# Patient Record
Sex: Male | Born: 1980 | Race: Black or African American | Hispanic: No | Marital: Single | Smoking: Current every day smoker
Health system: Southern US, Community
[De-identification: ages and names within clinical notes are randomized; demographics above are authoritative.]

---

## 2005-04-15 ENCOUNTER — Emergency Department (HOSPITAL_COMMUNITY): Admission: EM | Admit: 2005-04-15 | Discharge: 2005-04-15 | Payer: Self-pay | Admitting: Emergency Medicine

## 2018-10-11 ENCOUNTER — Encounter (HOSPITAL_COMMUNITY): Payer: Self-pay

## 2018-10-11 ENCOUNTER — Emergency Department (HOSPITAL_COMMUNITY)
Admission: EM | Admit: 2018-10-11 | Discharge: 2018-10-11 | Disposition: A | Discharge status: Home / Self Care | Payer: Self-pay | Attending: Emergency Medicine | Admitting: Emergency Medicine

## 2018-10-11 ENCOUNTER — Other Ambulatory Visit: Payer: Self-pay

## 2018-10-11 DIAGNOSIS — M545 Low back pain, unspecified: Secondary | ICD-10-CM

## 2018-10-11 DIAGNOSIS — F17210 Nicotine dependence, cigarettes, uncomplicated: Secondary | ICD-10-CM | POA: Insufficient documentation

## 2018-10-11 MED ORDER — NAPROXEN SODIUM 550 MG PO TABS: 550.0000 mg | ORAL_TABLET | Freq: Two times a day (BID) | ORAL | 0 refills | Status: DC

## 2018-10-11 MED ORDER — KETOROLAC TROMETHAMINE 60 MG/2ML IM SOLN
30.0000 mg | Freq: Once | INTRAMUSCULAR | Status: AC
  Administered 2018-10-11: 30 mg via INTRAMUSCULAR
  Filled 2018-10-11: qty 2

## 2018-10-11 MED ORDER — CYCLOBENZAPRINE HCL 10 MG PO TABS
10.0000 mg | ORAL_TABLET | Freq: Once | ORAL | Status: AC
  Administered 2018-10-11: 10 mg via ORAL
  Filled 2018-10-11: qty 1

## 2018-10-11 MED ORDER — CYCLOBENZAPRINE HCL 10 MG PO TABS: 10.0000 mg | ORAL_TABLET | Freq: Two times a day (BID) | ORAL | 0 refills | Status: DC | PRN

## 2018-10-11 NOTE — Discharge Instructions (Signed)
Do not take the the muscle relaxer if you are driving or at work because it will make you sleepy.

## 2018-10-11 NOTE — ED Triage Notes (Signed)
Patient c/o mid lower back pain and states pain radiates down the right leg since this AM. Patient states he does heavy lifting at his place of employment.

## 2018-10-11 NOTE — ED Provider Notes (Signed)
[facility]-EMERGENCY DEPT Provider Note   CSN: [id number] Arrival date & time: 10/11/18  1452     History   Chief Complaint Chief Complaint  Patient presents with  . Back Pain    HPI Kevin Morse is a 38 y.o. male who presents to the ED with back pain. Patient reports having back problems a year ago but not again until today. Patient reports that last week he was doing some heavy lifting at work and maybe that caused the problem. Patient denies loss of control of bladder or bowels.   The history is provided by the patient. No language interpreter was used.  Back Pain  Location:  Lumbar spine Quality:  Aching Pain severity:  Moderate Onset quality:  Gradual Duration:  1 day Timing:  Constant Progression:  Unchanged Chronicity:  New Context: lifting heavy objects   Relieved by:  Nothing Worsened by:  Ambulation and twisting Associated symptoms: no bladder incontinence and no bowel incontinence     History reviewed. No pertinent past medical history.  There are no active problems to display for this patient.   History reviewed. No pertinent surgical history.      Home Medications    Prior to Admission medications   Medication Sig Start Date End Date Taking? Authorizing Provider  cyclobenzaprine (FLEXERIL) 10 MG tablet Take 1 tablet (10 mg total) by mouth 2 (two) times daily as needed for muscle spasms. 10/11/18   Janne NapoleonNeese, Hope M, NP  naproxen sodium (ANAPROX DS) 550 MG tablet Take 1 tablet (550 mg total) by mouth 2 (two) times daily with a meal. 10/11/18   Janne NapoleonNeese, Hope M, NP    Family History Family History  Problem Relation Age of Onset  . Thyroid disease Mother     Social History Social History   Tobacco Use  . Smoking status: Current Every Day Smoker    Packs/day: 0.50    Types: Cigarettes  . Smokeless tobacco: Never Used  Substance Use Topics  . Alcohol use: Never    Frequency: Never  . Drug use: Not Currently    Types: Marijuana      Comment: none past 40 days     Allergies   Patient has no known allergies.   Review of Systems Review of Systems  Gastrointestinal: Negative for bowel incontinence.  Genitourinary: Negative for bladder incontinence.  Musculoskeletal: Positive for back pain.     Physical Exam Updated Vital Signs BP (!) 140/92 (BP Location: Right Arm)   Pulse 96   Temp 97.8 F (36.6 C) (Oral)   Resp 18   Ht 6' (1.829 m)   Wt 115.2 kg   SpO2 98%   BMI 34.45 kg/m   Physical Exam Vitals signs and nursing note reviewed.  Constitutional:      General: He is not in acute distress.    Appearance: He is well-developed.  HENT:     Head: Normocephalic and atraumatic.     Nose: Nose normal.  Eyes:     Conjunctiva/sclera: Conjunctivae normal.  Neck:     Musculoskeletal: Neck supple.  Cardiovascular:     Rate and Rhythm: Normal rate.  Pulmonary:     Effort: Pulmonary effort is normal.  Abdominal:     Palpations: Abdomen is soft.     Tenderness: There is no abdominal tenderness.  Musculoskeletal:     Lumbar back: He exhibits tenderness, pain and spasm. He exhibits no deformity and normal pulse. Decreased range of motion: due to pain.  Skin:  General: Skin is warm and dry.  Neurological:     Mental Status: He is alert and oriented to person, place, and time.     Cranial Nerves: No cranial nerve deficit.  Psychiatric:        Mood and Affect: Mood normal.      ED Treatments / Results  Labs (all labs ordered are listed, but only abnormal results are displayed) Labs Reviewed - No data to display  Radiology No results found.  Procedures Procedures (including critical care time)  Medications Ordered in ED Medications  ketorolac (TORADOL) injection 30 mg (has no administration in time range)  cyclobenzaprine (FLEXERIL) tablet 10 mg (10 mg Oral Given 10/11/18 1625)     Initial Impression / Assessment and Plan / ED Course  I have reviewed the triage vital signs and the  nursing notes.  Patient with back pain.  No neurological deficits and normal neuro exam.  Patient can walk but states is painful.  No loss of bowel or bladder control.  No concern for cauda equina.  No fever, night sweats, weight loss, h/o cancer, IVDU.  RICE protocol and pain medicine indicated and discussed with patient.  Final Clinical Impressions(s) / ED Diagnoses   Final diagnoses:  Acute midline low back pain without sciatica    ED Discharge Orders         Ordered    cyclobenzaprine (FLEXERIL) 10 MG tablet  2 times daily PRN     10/11/18 1618    naproxen sodium (ANAPROX DS) 550 MG tablet  2 times daily with meals     10/11/18 [address] [place], NP 10/11/18 1625    Linwood Dibbles, MD 10/13/18 1319

## 2018-12-12 ENCOUNTER — Other Ambulatory Visit: Payer: Self-pay

## 2018-12-12 ENCOUNTER — Encounter (HOSPITAL_COMMUNITY): Payer: Self-pay | Admitting: Emergency Medicine

## 2018-12-12 ENCOUNTER — Emergency Department (HOSPITAL_COMMUNITY)
Admission: EM | Admit: 2018-12-12 | Discharge: 2018-12-12 | Disposition: A | Discharge status: Home / Self Care | Payer: Self-pay | Attending: Emergency Medicine | Admitting: Emergency Medicine

## 2018-12-12 DIAGNOSIS — Z560 Unemployment, unspecified: Secondary | ICD-10-CM

## 2018-12-12 DIAGNOSIS — Z0279 Encounter for issue of other medical certificate: Secondary | ICD-10-CM | POA: Insufficient documentation

## 2018-12-12 NOTE — ED Triage Notes (Signed)
Patient reports missing work on Friday because he "wasn't feeling well." Requesting work note. Denies complaints now.

## 2018-12-12 NOTE — Discharge Instructions (Signed)
You were seen in the ED today for a work note for Friday March 20,2020. I am unable to provide a note for a previous day. Because you have no complaints, I have provided a note to go work to work December 13, 2018. I have also provided the number to the [facility] health clinic to establish primary care.

## 2018-12-12 NOTE — ED Provider Notes (Signed)
[facility]-EMERGENCY DEPT Provider Note   CSN: [id number] Arrival date & time: 12/12/18  1910    History   Chief Complaint Chief Complaint  Patient presents with  . needs work note    HPI Kevin Morse is a 38 y.o. male.     38 y.o male with no PMH presents to the ED requesting a work note for this past Friday, March 20.  Patient reports that day he had a low temp fever, abdominal pain, felt "I just did not feel well ".  He reports the symptoms resolved.  However, he was asked to bring a note to return back to work on Monday.  Patient is requesting for me to fill out form for his return back to work.  He denies any chest pain, shortness of breath, fever, other complaints.     History reviewed. No pertinent past medical history.  There are no active problems to display for this patient.   History reviewed. No pertinent surgical history.      Home Medications    Prior to Admission medications   Medication Sig Start Date End Date Taking? Authorizing Provider  cyclobenzaprine (FLEXERIL) 10 MG tablet Take 1 tablet (10 mg total) by mouth 2 (two) times daily as needed for muscle spasms. 10/11/18   Janne Napoleon, NP  naproxen sodium (ANAPROX DS) 550 MG tablet Take 1 tablet (550 mg total) by mouth 2 (two) times daily with a meal. 10/11/18   Janne Napoleon, NP    Family History Family History  Problem Relation Age of Onset  . Thyroid disease Mother     Social History Social History   Tobacco Use  . Smoking status: Current Every Day Smoker    Packs/day: 0.50    Types: Cigarettes  . Smokeless tobacco: Never Used  Substance Use Topics  . Alcohol use: Never    Frequency: Never  . Drug use: Not Currently    Types: Marijuana    Comment: none past 40 days     Allergies   Patient has no known allergies.   Review of Systems Review of Systems  Constitutional: Negative for fever.  Respiratory: Negative for cough.   Cardiovascular: Negative for chest  pain.     Physical Exam Updated Vital Signs BP (!) 136/93 (BP Location: Right Arm)   Pulse 83   Temp 99.4 F (37.4 C) (Oral)   Resp 18   SpO2 100%   Physical Exam Vitals signs and nursing note reviewed.  Constitutional:      Appearance: He is well-developed.     Comments: Well-appearing, on his cell phone.  HENT:     Head: Normocephalic and atraumatic.  Eyes:     General: No scleral icterus.    Pupils: Pupils are equal, round, and reactive to light.  Neck:     Musculoskeletal: Normal range of motion.  Cardiovascular:     Heart sounds: Normal heart sounds.  Pulmonary:     Effort: Pulmonary effort is normal.     Breath sounds: No wheezing.  Chest:     Chest wall: No tenderness.  Abdominal:     General: Bowel sounds are normal. There is no distension.     Palpations: Abdomen is soft.     Tenderness: There is no abdominal tenderness.  Musculoskeletal:        General: No tenderness or deformity.  Skin:    General: Skin is warm and dry.  Neurological:     Mental Status: He  is alert and oriented to person, place, and time.      ED Treatments / Results  Labs (all labs ordered are listed, but only abnormal results are displayed) Labs Reviewed - No data to display  EKG None  Radiology No results found.  Procedures Procedures (including critical care time)  Medications Ordered in ED Medications - No data to display   Initial Impression / Assessment and Plan / ED Course  I have reviewed the triage vital signs and the nursing notes.  Pertinent labs & imaging results that were available during my care of the patient were reviewed by me and considered in my medical decision making (see chart for details).      Patient with no chief complaints aside from requesting work note presents to the ED.  During evaluation patient stable, playing on his cell phone.  Reports he did not feel well on Friday and would like a note to be excused for not working on Friday.   During evaluation he reports no fever, cough, chest pain or shortness of breath.  He reports a form needs to be filled out in order for him to return back to work. I have informed patient I cannot provide a note for work for Friday due to not being seen in the ED at that time.  Patient understands at this time.  Will provide him with note to return back to work tomorrow without restrictions.  He once again denies any complaint at this time.  Patient discharged from the ED with stable vital signs.   Final Clinical Impressions(s) / ED Diagnoses   Final diagnoses:  Out of work    ED Discharge Orders    None       Claude Manges, Cordelia Poche 12/12/18 2219    Azalia Bilis, MD 12/12/18 2223

## 2019-01-12 ENCOUNTER — Ambulatory Visit (HOSPITAL_COMMUNITY)
Admission: EM | Admit: 2019-01-12 | Discharge: 2019-01-12 | Disposition: A | Discharge status: Home / Self Care | Payer: Self-pay | Attending: Family Medicine | Admitting: Family Medicine

## 2019-01-12 NOTE — ED Triage Notes (Signed)
Pt to follow up with either [facility]CP of [facility] for work return paperwork

## 2019-07-17 ENCOUNTER — Emergency Department (HOSPITAL_COMMUNITY): Payer: No Typology Code available for payment source

## 2019-07-17 ENCOUNTER — Encounter (HOSPITAL_COMMUNITY): Payer: Self-pay

## 2019-07-17 ENCOUNTER — Other Ambulatory Visit: Payer: Self-pay

## 2019-07-17 ENCOUNTER — Emergency Department (HOSPITAL_COMMUNITY)
Admission: EM | Admit: 2019-07-17 | Discharge: 2019-07-17 | Disposition: A | Discharge status: Home / Self Care | Payer: No Typology Code available for payment source | Attending: Emergency Medicine | Admitting: Emergency Medicine

## 2019-07-17 DIAGNOSIS — M546 Pain in thoracic spine: Secondary | ICD-10-CM | POA: Insufficient documentation

## 2019-07-17 DIAGNOSIS — Y999 Unspecified external cause status: Secondary | ICD-10-CM | POA: Diagnosis not present

## 2019-07-17 DIAGNOSIS — F17210 Nicotine dependence, cigarettes, uncomplicated: Secondary | ICD-10-CM | POA: Insufficient documentation

## 2019-07-17 DIAGNOSIS — Y92410 Unspecified street and highway as the place of occurrence of the external cause: Secondary | ICD-10-CM | POA: Insufficient documentation

## 2019-07-17 DIAGNOSIS — Z791 Long term (current) use of non-steroidal anti-inflammatories (NSAID): Secondary | ICD-10-CM | POA: Diagnosis not present

## 2019-07-17 DIAGNOSIS — M542 Cervicalgia: Secondary | ICD-10-CM | POA: Diagnosis not present

## 2019-07-17 DIAGNOSIS — M25562 Pain in left knee: Secondary | ICD-10-CM | POA: Insufficient documentation

## 2019-07-17 DIAGNOSIS — Y93I9 Activity, other involving external motion: Secondary | ICD-10-CM | POA: Diagnosis not present

## 2019-07-17 MED ORDER — METHOCARBAMOL 500 MG PO TABS: 500.0000 mg | ORAL_TABLET | Freq: Two times a day (BID) | ORAL | 0 refills | Status: DC

## 2019-07-17 MED ORDER — ACETAMINOPHEN 500 MG PO TABS: 500.0000 mg | ORAL_TABLET | Freq: Four times a day (QID) | ORAL | 0 refills | Status: AC | PRN

## 2019-07-17 MED ORDER — IBUPROFEN 600 MG PO TABS: 600.0000 mg | ORAL_TABLET | Freq: Four times a day (QID) | ORAL | 0 refills | Status: DC | PRN

## 2019-07-17 MED ORDER — KETOROLAC TROMETHAMINE 30 MG/ML IJ SOLN
30.0000 mg | Freq: Once | INTRAMUSCULAR | Status: AC
  Administered 2019-07-17: 30 mg via INTRAMUSCULAR
  Filled 2019-07-17: qty 1

## 2019-07-17 NOTE — ED Notes (Signed)
Patient transported to CT 

## 2019-07-17 NOTE — Discharge Instructions (Signed)
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
## 2019-07-17 NOTE — ED Notes (Signed)
Patient Alert and oriented to baseline. Stable and ambulatory to baseline. Patient verbalized understanding of the discharge instructions.  Patient belongings were taken by the patient.   
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
## 2021-06-01 ENCOUNTER — Emergency Department (HOSPITAL_COMMUNITY)
Admission: EM | Admit: 2021-06-01 | Discharge: 2021-06-01 | Disposition: A | Discharge status: Home / Self Care | Payer: No Typology Code available for payment source | Attending: Emergency Medicine | Admitting: Emergency Medicine

## 2021-06-01 ENCOUNTER — Encounter (HOSPITAL_COMMUNITY): Payer: Self-pay | Admitting: Emergency Medicine

## 2021-06-01 ENCOUNTER — Emergency Department (HOSPITAL_COMMUNITY): Payer: No Typology Code available for payment source

## 2021-06-01 ENCOUNTER — Other Ambulatory Visit: Payer: Self-pay

## 2021-06-01 DIAGNOSIS — S39012A Strain of muscle, fascia and tendon of lower back, initial encounter: Secondary | ICD-10-CM | POA: Diagnosis not present

## 2021-06-01 DIAGNOSIS — S161XXA Strain of muscle, fascia and tendon at neck level, initial encounter: Secondary | ICD-10-CM | POA: Diagnosis not present

## 2021-06-01 DIAGNOSIS — R0789 Other chest pain: Secondary | ICD-10-CM | POA: Insufficient documentation

## 2021-06-01 DIAGNOSIS — Y92410 Unspecified street and highway as the place of occurrence of the external cause: Secondary | ICD-10-CM | POA: Insufficient documentation

## 2021-06-01 DIAGNOSIS — F17210 Nicotine dependence, cigarettes, uncomplicated: Secondary | ICD-10-CM | POA: Diagnosis not present

## 2021-06-01 DIAGNOSIS — S199XXA Unspecified injury of neck, initial encounter: Secondary | ICD-10-CM | POA: Diagnosis present

## 2021-06-01 MED ORDER — CYCLOBENZAPRINE HCL 10 MG PO TABS
10.0000 mg | ORAL_TABLET | Freq: Once | ORAL | Status: AC
  Administered 2021-06-01: 10 mg via ORAL
  Filled 2021-06-01: qty 1

## 2021-06-01 MED ORDER — CYCLOBENZAPRINE HCL 10 MG PO TABS: 10.0000 mg | ORAL_TABLET | Freq: Two times a day (BID) | ORAL | 0 refills | Status: DC | PRN

## 2021-06-01 MED ORDER — ACETAMINOPHEN 500 MG PO TABS
1000.0000 mg | ORAL_TABLET | Freq: Once | ORAL | Status: AC
  Administered 2021-06-01: 1000 mg via ORAL
  Filled 2021-06-01: qty 2

## 2021-06-01 MED ORDER — KETOROLAC TROMETHAMINE 60 MG/2ML IM SOLN
30.0000 mg | Freq: Once | INTRAMUSCULAR | Status: AC
  Administered 2021-06-01: 30 mg via INTRAMUSCULAR
  Filled 2021-06-01: qty 2

## 2021-06-01 NOTE — Discharge Instructions (Addendum)
Recommend Tylenol and ibuprofen for pain control.  Will prescribe Flexeril for muscle spasms.  There is no evidence of acute fracture on your lumbar spine CT.
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
## 2021-09-02 ENCOUNTER — Emergency Department (HOSPITAL_COMMUNITY)
Admission: EM | Admit: 2021-09-02 | Discharge: 2021-09-02 | Disposition: A | Discharge status: Home / Self Care | Payer: Self-pay | Attending: Emergency Medicine | Admitting: Emergency Medicine

## 2021-09-02 DIAGNOSIS — M545 Low back pain, unspecified: Secondary | ICD-10-CM | POA: Insufficient documentation

## 2021-09-02 DIAGNOSIS — Z5321 Procedure and treatment not carried out due to patient leaving prior to being seen by health care provider: Secondary | ICD-10-CM | POA: Insufficient documentation
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
## 2021-09-02 NOTE — ED Notes (Signed)
PT called 3x for room w/o response. Pt not here at this time

## 2021-09-02 NOTE — ED Notes (Signed)
No response x1  

## 2022-11-16 ENCOUNTER — Emergency Department (HOSPITAL_COMMUNITY)
Admission: EM | Admit: 2022-11-16 | Discharge: 2022-11-16 | Disposition: A | Discharge status: Home / Self Care | Payer: Self-pay | Attending: Emergency Medicine | Admitting: Emergency Medicine

## 2022-11-16 ENCOUNTER — Encounter (HOSPITAL_COMMUNITY): Payer: Self-pay

## 2022-11-16 ENCOUNTER — Other Ambulatory Visit: Payer: Self-pay

## 2022-11-16 DIAGNOSIS — M5441 Lumbago with sciatica, right side: Secondary | ICD-10-CM | POA: Insufficient documentation

## 2022-11-16 DIAGNOSIS — M5126 Other intervertebral disc displacement, lumbar region: Secondary | ICD-10-CM | POA: Insufficient documentation

## 2022-11-16 DIAGNOSIS — G8929 Other chronic pain: Secondary | ICD-10-CM | POA: Insufficient documentation

## 2022-11-16 DIAGNOSIS — M48061 Spinal stenosis, lumbar region without neurogenic claudication: Secondary | ICD-10-CM | POA: Insufficient documentation

## 2022-11-16 DIAGNOSIS — M5442 Lumbago with sciatica, left side: Secondary | ICD-10-CM | POA: Insufficient documentation

## 2022-11-16 MED ORDER — IBUPROFEN 600 MG PO TABS: 600.0000 mg | ORAL_TABLET | Freq: Three times a day (TID) | ORAL | 0 refills | Status: AC | PRN

## 2022-11-16 MED ORDER — CYCLOBENZAPRINE HCL 10 MG PO TABS: 10.0000 mg | ORAL_TABLET | Freq: Three times a day (TID) | ORAL | 0 refills | Status: AC | PRN

## 2022-11-16 MED ORDER — KETOROLAC TROMETHAMINE 15 MG/ML IJ SOLN
15.0000 mg | Freq: Once | INTRAMUSCULAR | Status: AC
  Administered 2022-11-16: 15 mg via INTRAMUSCULAR
  Filled 2022-11-16: qty 1

## 2022-11-16 NOTE — Discharge Instructions (Signed)
Thank you for letting us take care of you today.  With your longstanding history of back pain and previous CT scan showing a herniated disc and spinal stenosis, I believe that it would be best for you to follow-up in the outpatient setting with primary care and neurosurgery. They may want to try medications, surgery, or physical therapy to help with your symptoms. To treat your current flareup of pain, I am prescribing a short course of muscle relaxants and NSAIDs. I have also provided some typical exercises for back pain that may help you manage your pain long-term. Please take the medications as prescribed. You may take Tylenol in addition to these medications. It is important to remain active to prevent further spasming of your back muscles and worsening of your pain.  If you develop fever, abdominal pain, weakness in your legs, inability to walk, urinate or defecate on yourself, or other new, concerning symptoms, please return to nearest emergency department for re-evaluation.
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
## 2022-11-16 NOTE — ED Notes (Signed)
AVS reviewed with pt prior to discharge. Pt verbalizes understanding. Belongings with pt upon depart. Ambulatory to POV by self.
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
## 2023-07-05 ENCOUNTER — Other Ambulatory Visit: Payer: Self-pay

## 2023-07-05 ENCOUNTER — Encounter (HOSPITAL_COMMUNITY): Payer: Self-pay | Admitting: Pharmacy Technician

## 2023-07-05 ENCOUNTER — Emergency Department (HOSPITAL_COMMUNITY)
Admission: EM | Admit: 2023-07-05 | Discharge: 2023-07-05 | Disposition: A | Discharge status: Home / Self Care | Payer: 59 | Attending: Emergency Medicine | Admitting: Emergency Medicine

## 2023-07-05 DIAGNOSIS — M5442 Lumbago with sciatica, left side: Secondary | ICD-10-CM | POA: Insufficient documentation

## 2023-07-05 DIAGNOSIS — M5441 Lumbago with sciatica, right side: Secondary | ICD-10-CM | POA: Diagnosis not present

## 2023-07-05 DIAGNOSIS — M545 Low back pain, unspecified: Secondary | ICD-10-CM | POA: Diagnosis not present

## 2023-07-05 DIAGNOSIS — G8929 Other chronic pain: Secondary | ICD-10-CM | POA: Insufficient documentation

## 2023-07-05 MED ORDER — METHOCARBAMOL 500 MG PO TABS
750.0000 mg | ORAL_TABLET | Freq: Once | ORAL | Status: AC
  Administered 2023-07-05: 750 mg via ORAL
  Filled 2023-07-05: qty 2

## 2023-07-05 MED ORDER — METHOCARBAMOL 750 MG PO TABS: 750.0000 mg | ORAL_TABLET | Freq: Two times a day (BID) | ORAL | 0 refills | Status: AC

## 2023-07-05 MED ORDER — IBUPROFEN 200 MG PO TABS
600.0000 mg | ORAL_TABLET | Freq: Once | ORAL | Status: AC
  Administered 2023-07-05: 600 mg via ORAL
  Filled 2023-07-05: qty 3

## 2023-07-05 MED ORDER — IBUPROFEN 600 MG PO TABS: 600.0000 mg | ORAL_TABLET | Freq: Four times a day (QID) | ORAL | 0 refills | Status: AC | PRN
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
## 2023-07-05 NOTE — Discharge Instructions (Addendum)
Take the medications as prescribed.   You have been given referrals to both orthopedics (Dr. Ave Filter) and neurosurgery (Dr. Maisie Fus). You can follow up with either in the outpatient setting for further management of recurrent low back pain.
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
## 2023-10-05 ENCOUNTER — Encounter (HOSPITAL_COMMUNITY): Payer: Self-pay

## 2023-10-05 ENCOUNTER — Other Ambulatory Visit: Payer: Self-pay

## 2023-10-05 ENCOUNTER — Emergency Department (HOSPITAL_COMMUNITY)
Admission: EM | Admit: 2023-10-05 | Discharge: 2023-10-06 | Discharge status: Against Medical Advice | Payer: 59 | Attending: Emergency Medicine | Admitting: Emergency Medicine

## 2023-10-05 DIAGNOSIS — K0889 Other specified disorders of teeth and supporting structures: Secondary | ICD-10-CM | POA: Diagnosis not present

## 2023-10-05 DIAGNOSIS — Z5321 Procedure and treatment not carried out due to patient leaving prior to being seen by health care provider: Secondary | ICD-10-CM | POA: Insufficient documentation

## 2023-10-05 DIAGNOSIS — R519 Headache, unspecified: Secondary | ICD-10-CM | POA: Insufficient documentation

## 2023-10-05 DIAGNOSIS — R220 Localized swelling, mass and lump, head: Secondary | ICD-10-CM | POA: Diagnosis not present

## 2023-10-05 NOTE — ED Triage Notes (Signed)
 C/o right sided dental pain with swelling and headache x1 day
